# Patient Record
Sex: Male | Born: 1965
Health system: Southern US, Community
[De-identification: ages and names within clinical notes are randomized; demographics above are authoritative.]

---

## 2016-11-18 DIAGNOSIS — Z0000 Encounter for general adult medical examination without abnormal findings: Secondary | ICD-10-CM | POA: Diagnosis not present

## 2016-11-18 DIAGNOSIS — K219 Gastro-esophageal reflux disease without esophagitis: Secondary | ICD-10-CM | POA: Diagnosis not present

## 2017-02-24 DIAGNOSIS — J019 Acute sinusitis, unspecified: Secondary | ICD-10-CM | POA: Diagnosis not present

## 2017-03-14 DIAGNOSIS — Z23 Encounter for immunization: Secondary | ICD-10-CM | POA: Diagnosis not present

## 2017-05-27 DIAGNOSIS — Z23 Encounter for immunization: Secondary | ICD-10-CM | POA: Diagnosis not present

## 2017-06-02 DIAGNOSIS — R05 Cough: Secondary | ICD-10-CM | POA: Diagnosis not present

## 2017-09-24 DIAGNOSIS — M75101 Unspecified rotator cuff tear or rupture of right shoulder, not specified as traumatic: Secondary | ICD-10-CM | POA: Diagnosis not present

## 2017-09-26 DIAGNOSIS — M531 Cervicobrachial syndrome: Secondary | ICD-10-CM | POA: Diagnosis not present

## 2017-09-26 DIAGNOSIS — M5032 Other cervical disc degeneration, mid-cervical region, unspecified level: Secondary | ICD-10-CM | POA: Diagnosis not present

## 2017-09-26 DIAGNOSIS — M9901 Segmental and somatic dysfunction of cervical region: Secondary | ICD-10-CM | POA: Diagnosis not present

## 2017-09-29 DIAGNOSIS — M531 Cervicobrachial syndrome: Secondary | ICD-10-CM | POA: Diagnosis not present

## 2017-09-29 DIAGNOSIS — M9901 Segmental and somatic dysfunction of cervical region: Secondary | ICD-10-CM | POA: Diagnosis not present

## 2017-09-29 DIAGNOSIS — M5032 Other cervical disc degeneration, mid-cervical region, unspecified level: Secondary | ICD-10-CM | POA: Diagnosis not present

## 2017-10-02 DIAGNOSIS — M531 Cervicobrachial syndrome: Secondary | ICD-10-CM | POA: Diagnosis not present

## 2017-10-02 DIAGNOSIS — M9901 Segmental and somatic dysfunction of cervical region: Secondary | ICD-10-CM | POA: Diagnosis not present

## 2017-10-02 DIAGNOSIS — M5032 Other cervical disc degeneration, mid-cervical region, unspecified level: Secondary | ICD-10-CM | POA: Diagnosis not present

## 2017-10-08 DIAGNOSIS — M5032 Other cervical disc degeneration, mid-cervical region, unspecified level: Secondary | ICD-10-CM | POA: Diagnosis not present

## 2017-10-08 DIAGNOSIS — M9901 Segmental and somatic dysfunction of cervical region: Secondary | ICD-10-CM | POA: Diagnosis not present

## 2017-10-08 DIAGNOSIS — M531 Cervicobrachial syndrome: Secondary | ICD-10-CM | POA: Diagnosis not present

## 2017-10-13 DIAGNOSIS — M531 Cervicobrachial syndrome: Secondary | ICD-10-CM | POA: Diagnosis not present

## 2017-10-13 DIAGNOSIS — M9901 Segmental and somatic dysfunction of cervical region: Secondary | ICD-10-CM | POA: Diagnosis not present

## 2017-10-13 DIAGNOSIS — M5032 Other cervical disc degeneration, mid-cervical region, unspecified level: Secondary | ICD-10-CM | POA: Diagnosis not present

## 2017-10-16 DIAGNOSIS — M531 Cervicobrachial syndrome: Secondary | ICD-10-CM | POA: Diagnosis not present

## 2017-10-16 DIAGNOSIS — M5032 Other cervical disc degeneration, mid-cervical region, unspecified level: Secondary | ICD-10-CM | POA: Diagnosis not present

## 2017-10-16 DIAGNOSIS — M9901 Segmental and somatic dysfunction of cervical region: Secondary | ICD-10-CM | POA: Diagnosis not present

## 2017-11-20 DIAGNOSIS — Z0000 Encounter for general adult medical examination without abnormal findings: Secondary | ICD-10-CM | POA: Diagnosis not present

## 2017-11-20 DIAGNOSIS — Z13220 Encounter for screening for lipoid disorders: Secondary | ICD-10-CM | POA: Diagnosis not present

## 2018-10-30 ENCOUNTER — Other Ambulatory Visit (HOSPITAL_COMMUNITY): Payer: Self-pay | Admitting: Family Medicine

## 2018-10-30 DIAGNOSIS — R0602 Shortness of breath: Secondary | ICD-10-CM

## 2018-11-02 ENCOUNTER — Ambulatory Visit (HOSPITAL_COMMUNITY): Payer: 59 | Attending: Internal Medicine

## 2018-11-02 ENCOUNTER — Encounter (INDEPENDENT_AMBULATORY_CARE_PROVIDER_SITE_OTHER): Payer: Self-pay

## 2018-11-02 ENCOUNTER — Other Ambulatory Visit: Payer: Self-pay

## 2018-11-02 DIAGNOSIS — R0602 Shortness of breath: Secondary | ICD-10-CM | POA: Insufficient documentation

## 2018-12-08 ENCOUNTER — Other Ambulatory Visit: Payer: Self-pay | Admitting: Family Medicine

## 2018-12-08 DIAGNOSIS — R7401 Elevation of levels of liver transaminase levels: Secondary | ICD-10-CM

## 2018-12-14 ENCOUNTER — Ambulatory Visit
Admission: RE | Admit: 2018-12-14 | Discharge: 2018-12-14 | Disposition: A | Discharge status: Home / Self Care | Payer: 59 | Source: Ambulatory Visit | Attending: Family Medicine | Admitting: Family Medicine

## 2018-12-14 DIAGNOSIS — R7401 Elevation of levels of liver transaminase levels: Secondary | ICD-10-CM

## 2019-04-15 ENCOUNTER — Ambulatory Visit: Payer: 59 | Attending: Internal Medicine

## 2019-04-15 DIAGNOSIS — Z23 Encounter for immunization: Secondary | ICD-10-CM

## 2019-04-15 NOTE — Progress Notes (Signed)
   Covid-19 Vaccination Clinic  Name:  Frank Holmes    MRN: [id number] DOB: 07/26/1965  04/15/2019  Frank Holmes was observed post Covid-19 immunization for 15 minutes without incident. He was provided with Vaccine Information Sheet and instruction to access the V-Safe system.   Frank Holmes was instructed to call 911 with any severe reactions post vaccine: Marland Kitchen Difficulty breathing  . Swelling of face and throat  . A fast heartbeat  . A bad rash all over body  . Dizziness and weakness   Immunizations Administered    Name Date Dose VIS Date Route   Moderna COVID-19 Vaccine 04/15/2019 11:11 AM 0.5 mL 01/05/2019 Intramuscular   Manufacturer: Moderna   Lot: [id number]   NDC: 48185-631-49

## 2019-05-19 ENCOUNTER — Ambulatory Visit: Payer: 59 | Attending: Internal Medicine

## 2019-05-19 DIAGNOSIS — Z23 Encounter for immunization: Secondary | ICD-10-CM

## 2019-05-19 NOTE — Progress Notes (Signed)
   Covid-19 Vaccination Clinic  Name:  Frank Holmes    MRN: [id number] DOB: 04-17-1965  05/19/2019  Frank Holmes was observed post Covid-19 immunization for 15 minutes without incident. He was provided with Vaccine Information Sheet and instruction to access the V-Safe system.   Frank Holmes was instructed to call 911 with any severe reactions post vaccine: Marland Kitchen Difficulty breathing  . Swelling of face and throat  . A fast heartbeat  . A bad rash all over body  . Dizziness and weakness   Immunizations Administered    Name Date Dose VIS Date Route   Moderna COVID-19 Vaccine 05/19/2019  3:45 PM 0.5 mL 01/05/2019 Intramuscular   Manufacturer: Moderna   Lot: [id number]   NDC: 10034-961-16

## 2020-10-14 IMAGING — US US ABDOMEN COMPLETE
1 series · 14 of 25 positions shown · non-contrast
Comparison: None.

CLINICAL DATA: Transaminitis.

EXAM:
ABDOMEN ULTRASOUND COMPLETE

[Series 1: us abdomen complete · 0.17mm/px · 14 of 84 slices shown]
[im 1/84]
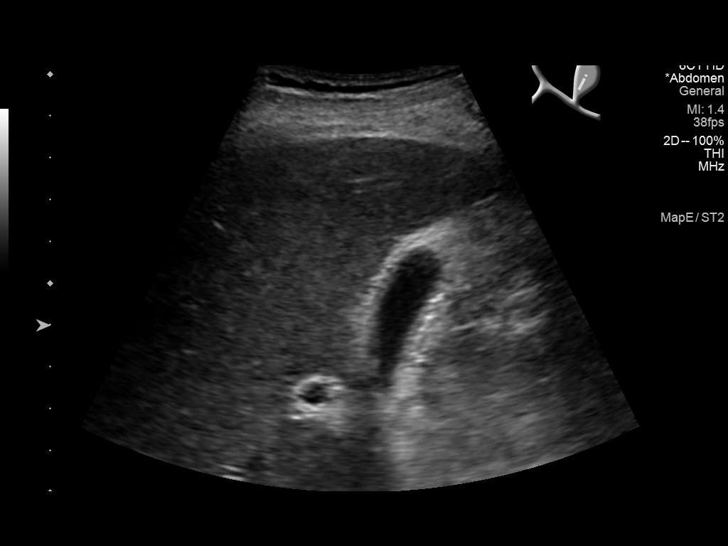
[im 7/84]
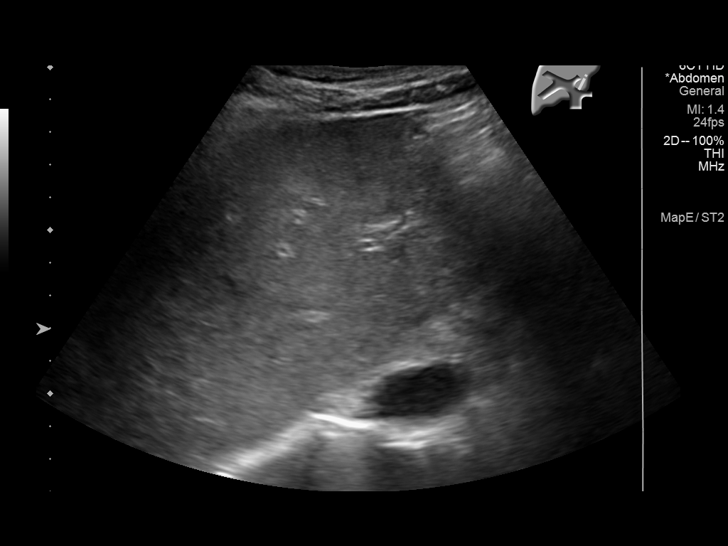
[im 14/84]
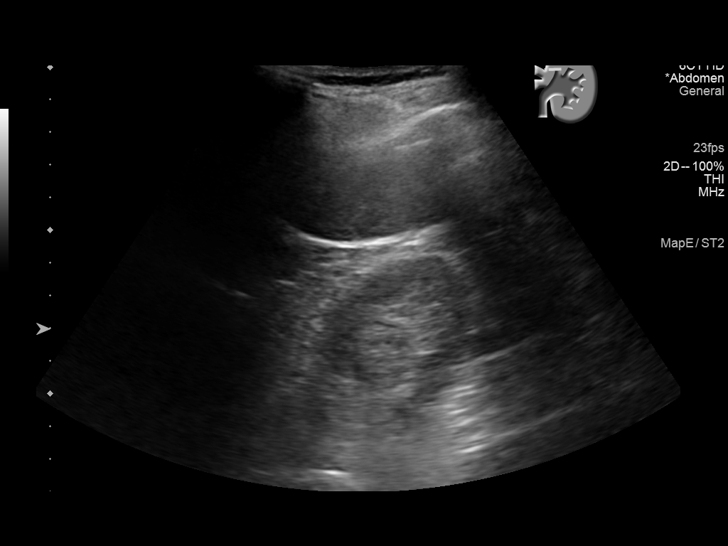
[im 21/84]
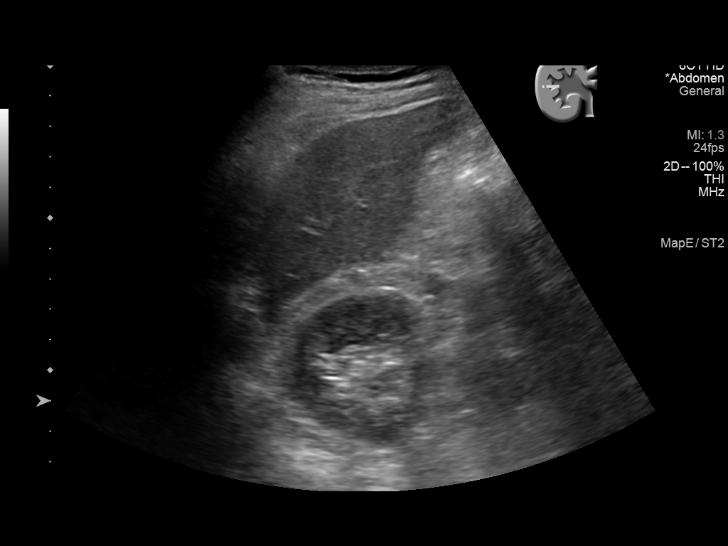
[im 28/84]
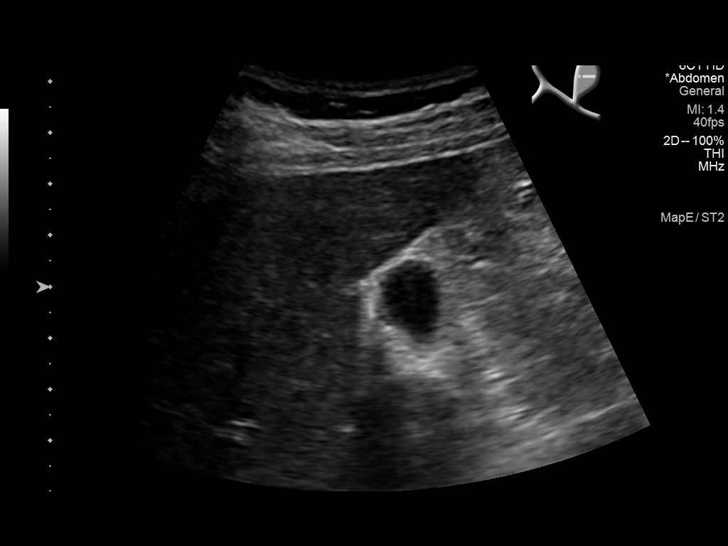
[im 32/84]
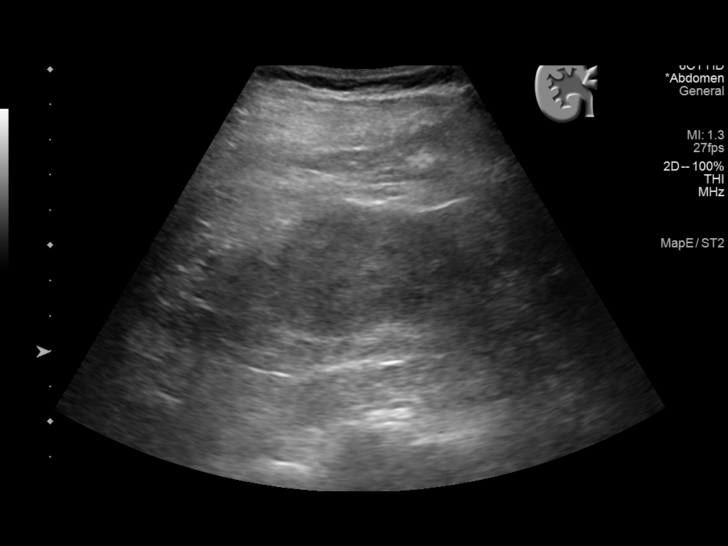
[im 39/84]
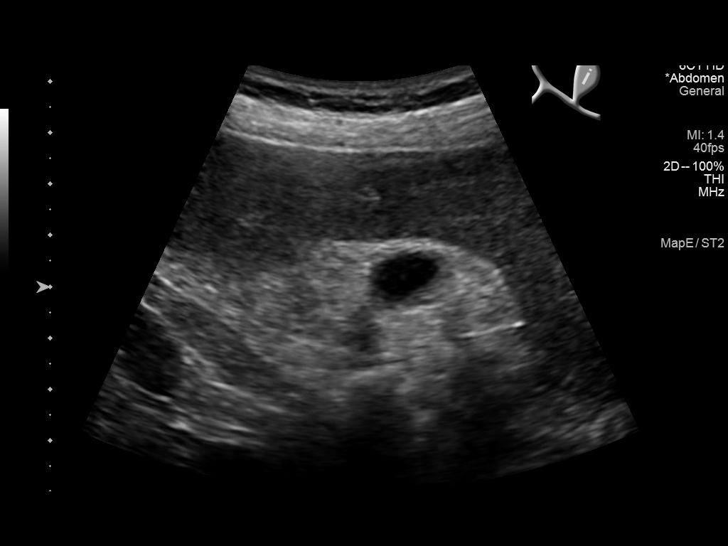
[im 45/84]
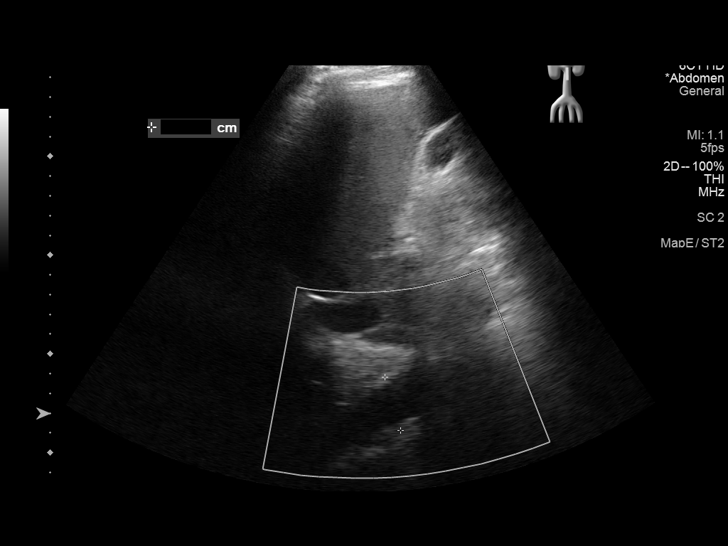
[im 52/84]
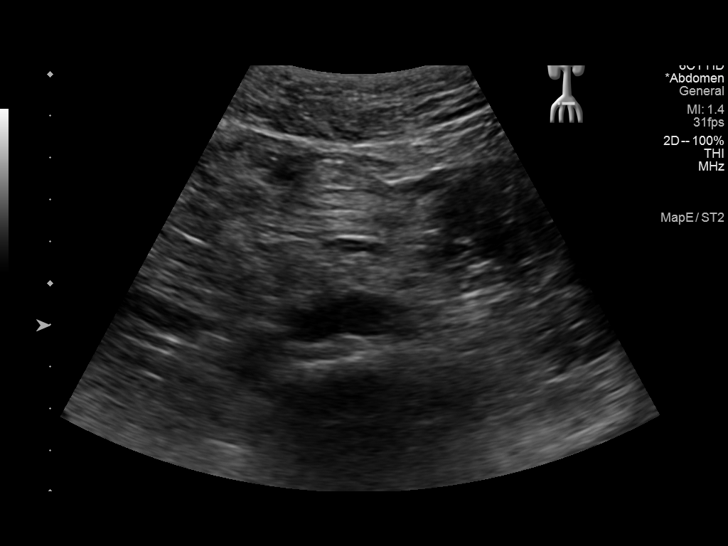
[im 56/84]
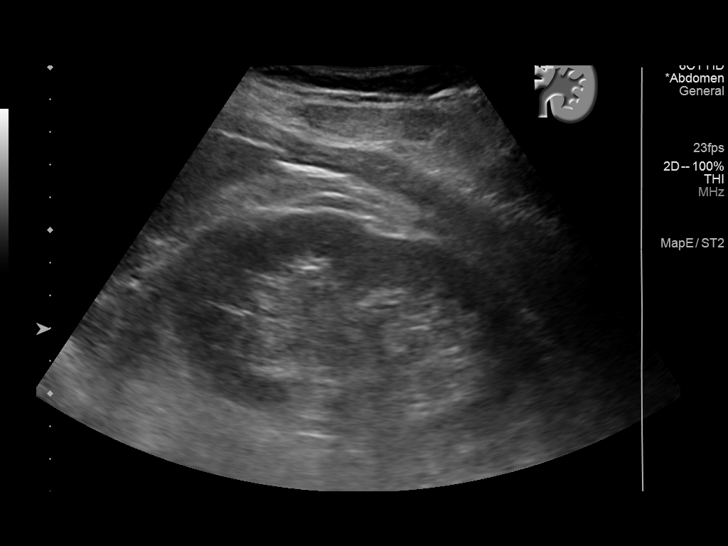
[im 63/84]
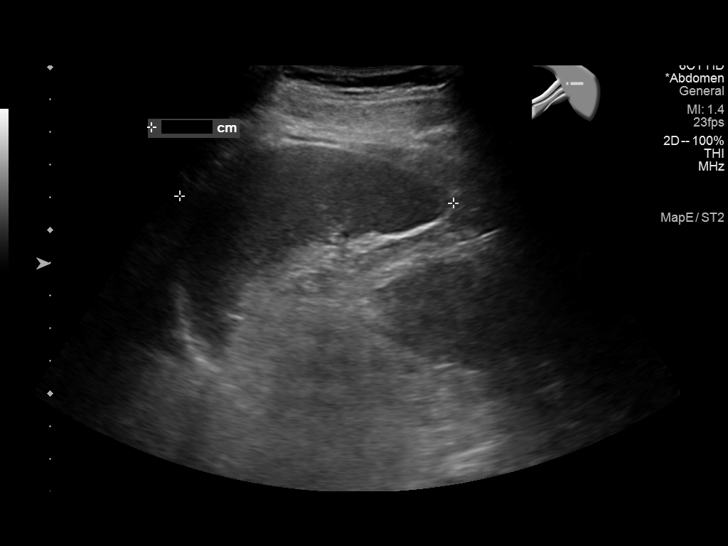
[im 70/84]
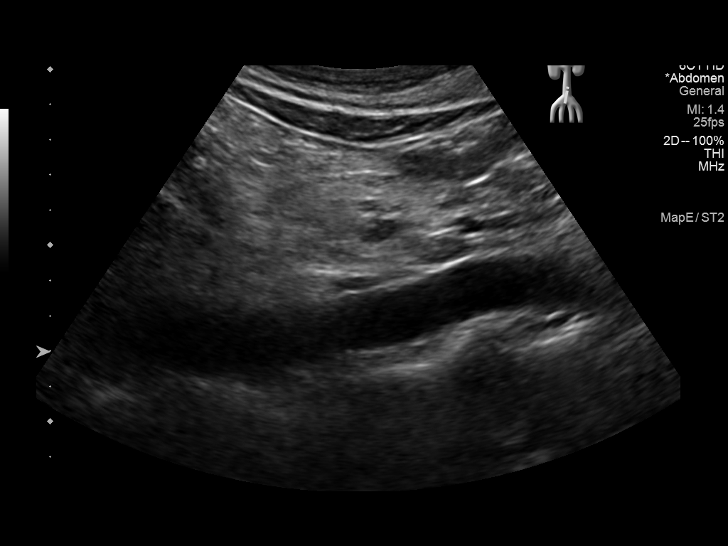
[im 77/84]
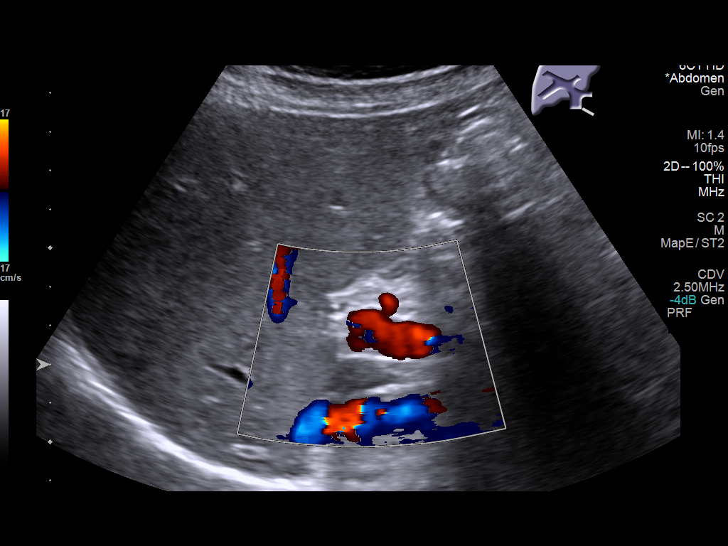
[im 84/84]
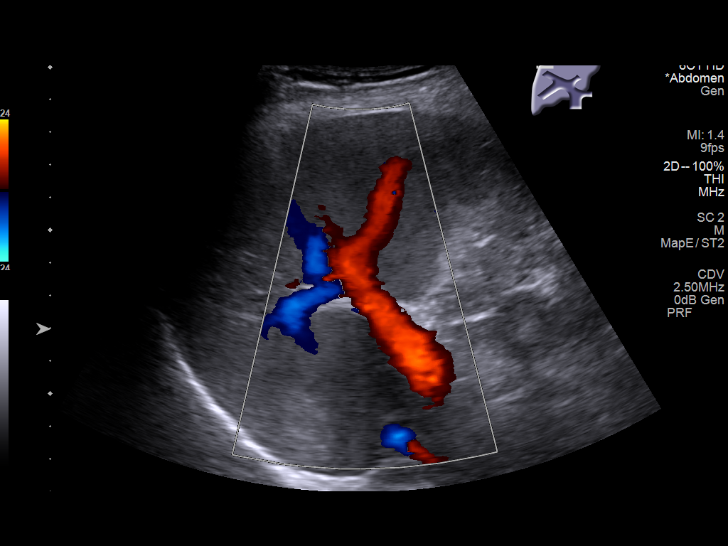

[14 of 25 positions shown; findings below may reference images not displayed]

FINDINGS: Gallbladder: No gallstones or wall thickening visualized. No
sonographic Murphy sign noted by sonographer.

Common bile duct: Diameter: 4 mm which is within normal limits.

Liver: No focal lesion identified. Within normal limits in
parenchymal echogenicity. Portal vein is patent on color Doppler
imaging with normal direction of blood flow towards the liver.

IVC: No abnormality visualized.

Pancreas: Not visualized due to overlying bowel gas.

Spleen: Size and appearance within normal limits.

Right Kidney: Length: 11.4 cm. Echogenicity within normal limits. No
mass or hydronephrosis visualized.

Left Kidney: Length: 11.2 cm. Echogenicity within normal limits. No
mass or hydronephrosis visualized.

Abdominal aorta: No aneurysm visualized.

Other findings: None.
IMPRESSION: Pancreas not visualized due to overlying bowel gas. No definite
abnormality seen in the abdomen.

## 2022-07-24 ENCOUNTER — Ambulatory Visit
Admission: RE | Admit: 2022-07-24 | Discharge: 2022-07-24 | Disposition: A | Discharge status: Home / Self Care | Payer: 59 | Source: Ambulatory Visit | Attending: Family Medicine | Admitting: Family Medicine

## 2022-07-24 ENCOUNTER — Other Ambulatory Visit: Payer: Self-pay | Admitting: Family Medicine

## 2022-07-24 DIAGNOSIS — M898X6 Other specified disorders of bone, lower leg: Secondary | ICD-10-CM

## 2023-09-13 ENCOUNTER — Encounter (HOSPITAL_BASED_OUTPATIENT_CLINIC_OR_DEPARTMENT_OTHER): Payer: Self-pay

## 2023-09-13 ENCOUNTER — Emergency Department (HOSPITAL_BASED_OUTPATIENT_CLINIC_OR_DEPARTMENT_OTHER)
Admission: EM | Admit: 2023-09-13 | Discharge: 2023-09-13 | Disposition: A | Discharge status: Home / Self Care | Attending: Emergency Medicine | Admitting: Emergency Medicine

## 2023-09-13 ENCOUNTER — Other Ambulatory Visit: Payer: Self-pay

## 2023-09-13 DIAGNOSIS — T782XXA Anaphylactic shock, unspecified, initial encounter: Secondary | ICD-10-CM | POA: Insufficient documentation

## 2023-09-13 MED ORDER — PREDNISONE 20 MG PO TABS: 40.0000 mg | ORAL_TABLET | Freq: Every day | ORAL | 0 refills | Status: AC

## 2023-09-13 MED ORDER — DIPHENHYDRAMINE HCL 50 MG/ML IJ SOLN
25.0000 mg | Freq: Once | INTRAMUSCULAR | Status: AC
  Administered 2023-09-13: 25 mg via INTRAVENOUS
  Filled 2023-09-13: qty 1

## 2023-09-13 MED ORDER — METHYLPREDNISOLONE SODIUM SUCC 125 MG IJ SOLR
125.0000 mg | Freq: Once | INTRAMUSCULAR | Status: AC
  Administered 2023-09-13: 125 mg via INTRAVENOUS
  Filled 2023-09-13: qty 2

## 2023-09-13 MED ORDER — EPINEPHRINE 0.3 MG/0.3ML IJ SOAJ: 0.3000 mg | INTRAMUSCULAR | 0 refills | Status: AC | PRN

## 2023-09-13 MED ORDER — FAMOTIDINE IN NACL 20-0.9 MG/50ML-% IV SOLN
20.0000 mg | Freq: Once | INTRAVENOUS | Status: AC
  Administered 2023-09-13: 20 mg via INTRAVENOUS
  Filled 2023-09-13: qty 50

## 2023-09-13 NOTE — ED Provider Notes (Signed)
 [facility] AT [facility] Provider Note   CSN: [id number] Arrival date & time: 09/13/23  1612    Patient presents with: Allergic Reaction   Frank Holmes is a 58 y.o. male for evaluation of allergic reaction.  Was bit by a paper wasp 2 lower leg just prior to arrival.  Immediately developed urticaria, facial tingling, lip swelling.  Use feeling members expired EpiPen  and EMS was called.  They gave additional EpiPen .  When he arrived here he had diffuse urticaria however no shortness of breath, sensation of throat closing, nausea vomiting abdominal pain, chest pain.   HPI     Prior to Admission medications   Medication Sig Start Date End Date Taking? Authorizing Provider  EPINEPHrine  0.3 mg/0.3 mL IJ SOAJ injection Inject 0.3 mg into the muscle as needed for anaphylaxis. 09/13/23  Yes Harry Shuck A, PA-C  predniSONE  (DELTASONE ) 20 MG tablet Take 2 tablets (40 mg total) by mouth daily for 5 days. 09/13/23 09/18/23 Yes Claretha Townshend A, PA-C    Allergies: Patient has no known allergies.    Review of Systems  Constitutional: Negative.   HENT:  Positive for facial swelling (resolved).   Respiratory: Negative.    Cardiovascular: Negative.   Gastrointestinal: Negative.   Genitourinary: Negative.   Musculoskeletal: Negative.   Skin:  Positive for rash.  Neurological: Negative.   All other systems reviewed and are negative.   Updated Vital Signs BP 125/81   Pulse 75   Temp 98.9 F (37.2 C) (Oral)   Resp 14   Ht 5' 7 (1.702 m)   Wt 74.8 kg   SpO2 96%   BMI 25.84 kg/m   Physical Exam Vitals and nursing note reviewed.  Constitutional:      General: He is not in acute distress.    Appearance: He is well-developed. He is not ill-appearing, toxic-appearing or diaphoretic.  HENT:     Head: Normocephalic and atraumatic.     Comments: No angioedema    Nose: Nose normal.     Mouth/Throat:     Mouth: Mucous membranes are moist.  Eyes:      Pupils: Pupils are equal, round, and reactive to light.  Cardiovascular:     Rate and Rhythm: Normal rate and regular rhythm.     Pulses: Normal pulses.          Radial pulses are 2+ on the right side and 2+ on the left side.       Dorsalis pedis pulses are 2+ on the right side and 2+ on the left side.     Heart sounds: Normal heart sounds.  Pulmonary:     Effort: Pulmonary effort is normal. No respiratory distress.     Breath sounds: Normal breath sounds.  Abdominal:     General: Bowel sounds are normal. There is no distension.     Palpations: Abdomen is soft.     Tenderness: There is no abdominal tenderness.  Musculoskeletal:        General: Normal range of motion.     Cervical back: Normal range of motion and neck supple.  Skin:    General: Skin is warm and dry.     Findings: Rash present.     Comments: Diffuse urticaria bilateral lower extremities  Neurological:     General: No focal deficit present.     Mental Status: He is alert and oriented to person, place, and time.     Cranial Nerves: No cranial nerve deficit.  Sensory: No sensory deficit.     Motor: No weakness.     Gait: Gait normal.     (all labs ordered are listed, but only abnormal results are displayed) Labs Reviewed - No data to display  EKG: None  Radiology: No results found.   .Critical Care  Performed by: Edie Rosebud LABOR, PA-C Authorized by: Edie Rosebud LABOR, PA-C   Critical care provider statement:    Critical care time (minutes):  32   Critical care was necessary to treat or prevent imminent or life-threatening deterioration of the following conditions: Anaphylaxis.   Critical care was time spent personally by me on the following activities:  Development of treatment plan with patient or surrogate, discussions with consultants, evaluation of patient's response to treatment, examination of patient, ordering and review of laboratory studies, ordering and review of radiographic studies,  ordering and performing treatments and interventions, pulse oximetry, re-evaluation of patient's condition and review of old charts    Medications Ordered in the ED  diphenhydrAMINE  (BENADRYL ) injection 25 mg (25 mg Intravenous Given 09/13/23 1624)  methylPREDNISolone  sodium succinate (SOLU-MEDROL ) 125 mg/2 mL injection 125 mg (125 mg Intravenous Given 09/13/23 1627)  famotidine  (PEPCID ) IVPB 20 mg premix (0 mg Intravenous Stopped 09/13/23 6049)   58 year old here for evaluation of allergic reaction.  Bit by wasps lower extremity just prior to arrival.  Developed diffuse urticaria, facial swelling, throat tingling.  Given IM epinephrine  for facial swelling, throat tingling resolved prior to arrival.  On arrival he does have diffuse urticaria, no angioedema.  His posterior pharynx is clear.  He has easy work of breathing.  Patient with anaphylaxis. Will give IV meds and observe  Patient observed for greater than 3 hours.  Symptoms improved.  Refer EpiPen , medications at home, instructed antihistamine such as Zyrtec/Claritin at home  Will have him return for any worsening symptoms  The patient has been appropriately medically screened and/or stabilized in the ED. I have low suspicion for any other emergent medical condition which would require further screening, evaluation or treatment in the ED or require inpatient management.  Patient is hemodynamically stable and in no acute distress.  Patient able to ambulate in department prior to ED.  Evaluation does not show acute pathology that would require ongoing or additional emergent interventions while in the emergency department or further inpatient treatment.  I have discussed the diagnosis with the patient and answered all questions.  Pain is been managed while in the emergency department and patient has no further complaints prior to discharge.  Patient is comfortable with plan discussed in room and is stable for discharge at this time.  I have discussed  strict return precautions for returning to the emergency department.  Patient was encouraged to follow-up with PCP/specialist refer to at discharge.                                     Medical Decision Making Amount and/or Complexity of Data Reviewed Independent Historian: EMS External Data Reviewed: labs, radiology and notes. Discussion of management or test interpretation with external provider(s): EMS, family in room  Risk OTC drugs. Prescription drug management. Decision regarding hospitalization. Diagnosis or treatment significantly limited by social determinants of health.        Final diagnoses:  Anaphylaxis, initial encounter    ED Discharge Orders          Ordered    EPINEPHrine  0.3 mg/0.3  mL IJ SOAJ injection  As needed        09/13/23 1915    predniSONE  (DELTASONE ) 20 MG tablet  Daily        09/13/23 1915               Brynnlie Unterreiner A, PA-C 09/13/23 1917    Mannie Pac T, DO 09/14/23 1534

## 2023-09-13 NOTE — ED Notes (Signed)
 He tells me that he feels better. He tells me that his lip swelling is much better and he denies any sensation of throat swelling. He Is breathing normally.

## 2023-09-13 NOTE — ED Triage Notes (Signed)
 Patient brought in by [facility] with allergic reaction to wasp. Prior to arrival, patient gave himself an expired EpiPen  (wife's prescribed medication). EMS reports giving additional IM Epi due to patient complaining of throat closing. Hives noted to lower extremities. Patient denies shortness of breath or throat swelling. Patient is AAOx4, respirs even and unlabored.

## 2023-09-13 NOTE — Discharge Instructions (Addendum)
 It was a pleasure taking care of you here today  I have written you for a few medications to help with your symptoms  May also take Zyrtec or Claritin at home over the next week  Return for any worsening symptoms
